# Patient Record
Sex: Female | Born: 1965 | Race: Black or African American | Hispanic: No | Marital: Married | Smoking: Never smoker
Health system: Southern US, Community
[De-identification: ages and names within clinical notes are randomized; demographics above are authoritative.]

## PROBLEM LIST (undated history)

## (undated) DIAGNOSIS — M549 Dorsalgia, unspecified: Secondary | ICD-10-CM

## (undated) DIAGNOSIS — G8929 Other chronic pain: Secondary | ICD-10-CM

## (undated) DIAGNOSIS — R519 Headache, unspecified: Secondary | ICD-10-CM

## (undated) DIAGNOSIS — R51 Headache: Secondary | ICD-10-CM

## (undated) HISTORY — PX: ABDOMINAL HYSTERECTOMY: SHX81

---

## 1998-07-28 ENCOUNTER — Other Ambulatory Visit: Admission: RE | Admit: 1998-07-28 | Discharge: 1998-07-28 | Payer: Self-pay | Admitting: Obstetrics and Gynecology

## 1999-07-27 ENCOUNTER — Other Ambulatory Visit: Admission: RE | Admit: 1999-07-27 | Discharge: 1999-07-27 | Payer: Self-pay | Admitting: *Deleted

## 2000-10-17 ENCOUNTER — Other Ambulatory Visit: Admission: RE | Admit: 2000-10-17 | Discharge: 2000-10-17 | Payer: Self-pay | Admitting: *Deleted

## 2001-11-14 ENCOUNTER — Other Ambulatory Visit: Admission: RE | Admit: 2001-11-14 | Discharge: 2001-11-14 | Payer: Self-pay | Admitting: Obstetrics and Gynecology

## 2002-12-02 ENCOUNTER — Other Ambulatory Visit: Admission: RE | Admit: 2002-12-02 | Discharge: 2002-12-02 | Payer: Self-pay | Admitting: Obstetrics and Gynecology

## 2009-10-29 ENCOUNTER — Encounter: Admission: RE | Admit: 2009-10-29 | Discharge: 2009-10-29 | Payer: Self-pay | Admitting: Obstetrics and Gynecology

## 2010-02-15 ENCOUNTER — Observation Stay (HOSPITAL_COMMUNITY): Admit: 2010-02-15 | Discharge: 2010-02-15 | Payer: Self-pay | Admitting: Obstetrics

## 2010-02-16 ENCOUNTER — Encounter (INDEPENDENT_AMBULATORY_CARE_PROVIDER_SITE_OTHER): Payer: Self-pay | Admitting: Obstetrics

## 2010-02-16 ENCOUNTER — Ambulatory Visit (HOSPITAL_COMMUNITY): Admission: RE | Admit: 2010-02-16 | Discharge: 2010-02-17 | Payer: Self-pay | Admitting: Obstetrics

## 2010-11-07 LAB — CROSSMATCH: Antibody Screen: NEGATIVE

## 2010-11-07 LAB — CBC
HCT: 33.5 % — ABNORMAL LOW (ref 36.0–46.0)
Hemoglobin: 10.3 g/dL — ABNORMAL LOW (ref 12.0–15.0)
MCH: 19.3 pg — ABNORMAL LOW (ref 26.0–34.0)
MCH: 19.7 pg — ABNORMAL LOW (ref 26.0–34.0)
MCV: 62.8 fL — ABNORMAL LOW (ref 78.0–100.0)
Platelets: 341 10*3/uL (ref 150–400)
RBC: 4.38 MIL/uL (ref 3.87–5.11)
RBC: 5.34 MIL/uL — ABNORMAL HIGH (ref 3.87–5.11)
WBC: 13 10*3/uL — ABNORMAL HIGH (ref 4.0–10.5)

## 2010-11-07 LAB — SURGICAL PCR SCREEN
MRSA, PCR: NEGATIVE
Staphylococcus aureus: NEGATIVE

## 2010-11-07 LAB — TYPE AND SCREEN: ABO/RH(D): B POS

## 2010-11-07 LAB — BASIC METABOLIC PANEL
CO2: 27 mEq/L (ref 19–32)
Chloride: 100 mEq/L (ref 96–112)
GFR calc Af Amer: >60 mL/min (ref >60)
Potassium: 3.5 mEq/L (ref 3.5–5.1)
Sodium: 135 mEq/L (ref 135–145)

## 2011-10-31 ENCOUNTER — Other Ambulatory Visit: Payer: Self-pay | Admitting: Family Medicine

## 2011-10-31 DIAGNOSIS — Z1231 Encounter for screening mammogram for malignant neoplasm of breast: Secondary | ICD-10-CM

## 2011-11-07 ENCOUNTER — Ambulatory Visit
Admission: RE | Admit: 2011-11-07 | Discharge: 2011-11-07 | Disposition: A | Discharge status: Home / Self Care | Payer: 59 | Source: Ambulatory Visit | Attending: Family Medicine | Admitting: Family Medicine

## 2011-11-07 DIAGNOSIS — Z1231 Encounter for screening mammogram for malignant neoplasm of breast: Secondary | ICD-10-CM

## 2012-11-01 ENCOUNTER — Other Ambulatory Visit: Payer: Self-pay | Admitting: Family Medicine

## 2012-11-01 DIAGNOSIS — Z1231 Encounter for screening mammogram for malignant neoplasm of breast: Secondary | ICD-10-CM

## 2012-11-26 ENCOUNTER — Ambulatory Visit: Payer: 59

## 2013-07-09 ENCOUNTER — Ambulatory Visit
Admission: RE | Admit: 2013-07-09 | Discharge: 2013-07-09 | Disposition: A | Discharge status: Home / Self Care | Payer: 59 | Source: Ambulatory Visit | Attending: Family Medicine | Admitting: Family Medicine

## 2013-07-09 DIAGNOSIS — Z1231 Encounter for screening mammogram for malignant neoplasm of breast: Secondary | ICD-10-CM

## 2014-07-01 ENCOUNTER — Other Ambulatory Visit: Payer: Self-pay

## 2014-07-01 DIAGNOSIS — Z1231 Encounter for screening mammogram for malignant neoplasm of breast: Secondary | ICD-10-CM

## 2014-07-22 ENCOUNTER — Ambulatory Visit
Admission: RE | Admit: 2014-07-22 | Discharge: 2014-07-22 | Disposition: A | Discharge status: Home / Self Care | Payer: 59 | Source: Ambulatory Visit

## 2014-07-22 DIAGNOSIS — Z1231 Encounter for screening mammogram for malignant neoplasm of breast: Secondary | ICD-10-CM

## 2014-10-03 ENCOUNTER — Emergency Department (HOSPITAL_COMMUNITY)
Admission: EM | Admit: 2014-10-03 | Discharge: 2014-10-03 | Disposition: A | Discharge status: Home / Self Care | Payer: 59 | Attending: Emergency Medicine | Admitting: Emergency Medicine

## 2014-10-03 ENCOUNTER — Encounter (HOSPITAL_COMMUNITY): Payer: Self-pay | Admitting: *Deleted

## 2014-10-03 ENCOUNTER — Emergency Department (HOSPITAL_COMMUNITY): Payer: 59

## 2014-10-03 ENCOUNTER — Other Ambulatory Visit: Payer: Self-pay

## 2014-10-03 DIAGNOSIS — S199XXA Unspecified injury of neck, initial encounter: Secondary | ICD-10-CM | POA: Insufficient documentation

## 2014-10-03 DIAGNOSIS — M7918 Myalgia, other site: Secondary | ICD-10-CM

## 2014-10-03 DIAGNOSIS — Y92410 Unspecified street and highway as the place of occurrence of the external cause: Secondary | ICD-10-CM | POA: Diagnosis not present

## 2014-10-03 DIAGNOSIS — Z880 Allergy status to penicillin: Secondary | ICD-10-CM | POA: Insufficient documentation

## 2014-10-03 DIAGNOSIS — S3992XA Unspecified injury of lower back, initial encounter: Secondary | ICD-10-CM | POA: Insufficient documentation

## 2014-10-03 DIAGNOSIS — Y9389 Activity, other specified: Secondary | ICD-10-CM | POA: Insufficient documentation

## 2014-10-03 DIAGNOSIS — Y998 Other external cause status: Secondary | ICD-10-CM | POA: Diagnosis not present

## 2014-10-03 DIAGNOSIS — S4991XA Unspecified injury of right shoulder and upper arm, initial encounter: Secondary | ICD-10-CM | POA: Insufficient documentation

## 2014-10-03 DIAGNOSIS — G8929 Other chronic pain: Secondary | ICD-10-CM | POA: Diagnosis not present

## 2014-10-03 HISTORY — DX: Headache: R51

## 2014-10-03 HISTORY — DX: Other chronic pain: G89.29

## 2014-10-03 HISTORY — DX: Dorsalgia, unspecified: M54.9

## 2014-10-03 HISTORY — DX: Headache, unspecified: R51.9

## 2014-10-03 MED ORDER — OXYCODONE-ACETAMINOPHEN 5-325 MG PO TABS
ORAL_TABLET | ORAL | Status: AC
Start: 2014-10-03 — End: ?

## 2014-10-03 MED ORDER — IBUPROFEN 400 MG PO TABS
400.0000 mg | ORAL_TABLET | Freq: Once | ORAL | Status: AC
  Administered 2014-10-03: 400 mg via ORAL
  Filled 2014-10-03: qty 1

## 2014-10-03 MED ORDER — ACETAMINOPHEN 500 MG PO TABS
1000.0000 mg | ORAL_TABLET | Freq: Once | ORAL | Status: AC
Start: 2014-10-03 — End: 2014-10-03
  Administered 2014-10-03: 1000 mg via ORAL
  Filled 2014-10-03: qty 2

## 2014-10-03 MED ORDER — METHOCARBAMOL 500 MG PO TABS: 1000.0000 mg | ORAL_TABLET | Freq: Four times a day (QID) | ORAL | Status: AC | PRN

## 2014-10-03 MED ORDER — NAPROXEN 250 MG PO TABS: 250.0000 mg | ORAL_TABLET | Freq: Two times a day (BID) | ORAL | Status: AC | PRN

## 2014-10-03 NOTE — Discharge Instructions (Signed)
°Emergency Department Resource Guide °1) Find a Doctor and Pay Out of Pocket °Although you won't have to find out who is covered by your insurance plan, it is a good idea to ask around and get recommendations. You will then need to call the office and see if the doctor you have chosen will accept you as a new patient and what types of options they offer for patients who are self-pay. Some doctors offer discounts or will set up payment plans for their patients who do not have insurance, but you will need to ask so you aren't surprised when you get to your appointment. ° °2) Contact Your Local Health Department °Not all health departments have doctors that can see patients for sick visits, but many do, so it is worth a call to see if yours does. If you don't know where your local health department is, you can check in your phone book. The CDC also has a tool to help you locate your state's health department, and many state websites also have listings of all of their local health departments. ° °3) Find a Walk-in Clinic °If your illness is not likely to be very severe or complicated, you may want to try a walk in clinic. These are popping up all over the country in pharmacies, drugstores, and shopping centers. They're usually staffed by nurse practitioners or physician assistants that have been trained to treat common illnesses and complaints. They're usually fairly quick and inexpensive. However, if you have serious medical issues or chronic medical problems, these are probably not your best option. ° °No Primary Care Doctor: °- Call [facility] at  832-8000 - they can help you locate a primary care doctor that  accepts your insurance, provides certain services, etc. °- Physician Referral Service- 1-800-533-3463 ° °Chronic Pain Problems: °Organization         Address  Phone   Notes  °[facility]  (336) 297-2271 Patients need to be referred by their primary care doctor.  ° °Medication  Assistance: °Organization         Address  Phone   Notes  °[place] [facility] [address] °[place], [place] [address] (336) 641-8030 --Must be a resident of [place] °-- Must have NO insurance coverage whatsoever (no Medicaid/ Medicare, etc.) °-- The pt. MUST have a primary care doctor that directs their care regularly and follows them in the community °  °[facility]  (866) 331-1348   °[employer]  (888) 892-1162   ° °Agencies that provide inexpensive medical care: °Organization         Address  Phone   Notes  °[facility]  (336) 832-8035   °[facility]    (336) 832-7272   °[facility] [address] °[place], [place] [address] (336) 832-4777   °[facility] [address], °[place] (336) 271-4999   °[facility]    (336) 373-0678   °[facility]    (336) 272-1050   °[facility] ° [address], [place] Phone:  (336) 832-4444, Fax:  (336) 832-4440 Hours of Operation:  9 am - 6 pm, M-F.  Also accepts Medicaid/Medicare and self-pay.  °[facility] ° [address], [place] Phone: (336) 832-3150, Fax: (336) 832-3151. Hours of Operation:  8:30 am - 5:30 pm, M-F.  Also accepts Medicaid and self-pay.  °[facility] [place] [address], [place] Phone: (336) 878-6027   °[facility] [address], [place], [place] (336)723-1848, Ext. 123 Mondays & Thursdays: 7-9 AM.  First 15 patients are seen on a first come, first serve basis. °  ° °Medicaid-accepting [place] Providers: ° °Organization         Address  Phone   Notes  °[facility] [address], [place] (336) 641-2100 Also accepts self-pay patients.  °[facility] [address], [place] ° (336) 856-9996   °[facility] [address], [place]  (336) 288-8857   °[facility] [address] [place] [address], [place] (336) 299-7000   °Veita Bland [address], [place]  ° (336) 373-1557 Only accepts [place] Medicaid patients after they have their name applied to their card.  ° °Self-Pay (no insurance) in [place]: ° °Organization         Address  Phone   Notes  °Sickle Cell Patients, [facility] [address], [place] (336) 832-1970   °[facility] [address], [place] (336) 832-4400   °[facility] ° [address], [place] (336) 992-4800   °[facility]/Dr. Osei-Bonsu ° [address] [place] [address], [place] or [address], [place] (336) 841-8500 Phone number for both [place] and [place] locations is the same.  °[facility] [address], [place] (336) 299-0000   °[facility] [address] [place] [address], [place] or [address] (336) 852-7530 °(336) 878-2260   °[facility] [address], [place] (336) 350-1642, phone; (336) 294-5005, fax Sees patients 1st and 3rd Saturday of every month.  Must not qualify for public or private insurance (i.e. Medicaid, Medicare, [place] Health Choice, Veterans' Benefits) • Household income should be no more than 200% of the poverty level •The clinic cannot treat you if you are pregnant or think you are pregnant • Sexually transmitted diseases are not treated at the clinic.  ° ° °Dental Care: °Organization         Address  Phone  Notes  °[place] [facility] [address], [place] (336) 641-6152 Accepts children up to age 21 who are enrolled in Medicaid or [place] Health Choice; pregnant women with a Medicaid card; and children who have applied for Medicaid or [place] Health Choice, but were declined, whose parents can pay a reduced fee at time of service.  °[place]  [facility] [place]  [address], [place] (336) 641-7733 Accepts children up to age 21 who are enrolled in Medicaid or [place] Health Choice; pregnant women with a Medicaid card; and children who have applied for Medicaid or  Health Choice, but were declined, whose parents can pay a reduced fee at time of service.  °[facility] ° [address],  (336) 641-4533 Patients are seen by appointment only. Walk-ins are not accepted. [facility] will see patients 18 years of age and older. °Monday - Tuesday (8am-5pm) °Most Wednesdays (8:30-5pm) °$30 per visit, cash only  °[facility] ° [address], [place] (336) 641-4533 Patients are seen by appointment only. Walk-ins are not accepted. [facility] will see patients 18 years of age and older. °One   Wednesday Evening (Monthly: Volunteer Based).  $30 per visit, cash only  °[facility]  (919) 537-3737 for adults; Children under age 4, call Graduate Pediatric Dentistry at (919) [phone]. Children aged 4-14, please call (919) [phone] to request a pediatric application. ° Dental services are provided in all areas of dental care including fillings, crowns and bridges, complete and partial dentures, implants, gum treatment, root canals, and extractions. Preventive care is also provided. Treatment is provided to both adults and children. °Patients are selected via a lottery and there is often a waiting list. °  °[facility] [address], °[place] ° (336) 763-8833 [email or web address] °  °[facility] [address], [place], [place] (336)723-1848, Ext. 123 Second and Fourth Thursday of each month, opens at 6:30 AM; Clinic ends at 9 AM.  Patients are seen on a first-come first-served basis, and a limited number are seen during each clinic.  ° °[facility] ° [address], [place], [place] (336) 723-7904    Eligibility Requirements °You must have lived in [place], [place], or [place] counties for at least the last three months. °  You cannot be eligible for state or federal sponsored healthcare insurance, including Veterans Administration, Medicaid, or Medicare. °  You generally cannot be eligible for healthcare insurance through your employer.  °  How to apply: °Eligibility screenings are held every Tuesday and Wednesday afternoon from 1:00 pm until 4:00 pm. You do not need an appointment for the interview!  °[facility] [address], [place], [place] 336-631-2330   °[facility]  336-342-8273   °[facility]  336-703-3100   °[facility]  336-570-6415   ° °Behavioral Health Resources in the Community: °Intensive Outpatient Programs °Organization         Address  Phone  Notes  °[facility] [address], [place], [place] 336-878-6098   °[facility] [address], [place], [place] 336-832-9800   °[facility] [address], [place], [place] ° 336-882-2125   °[facility] [address],  °[place], [place] 1-800-853-5163 or 336-641-4981   °Substance Abuse Resources °Organization         Address  Phone  Notes  °[facility]  336-882-2125   °[facility]  336-784-9470   °[facility]  336-285-9073   °[facility]  336-845-3988   °Residential & Outpatient Substance Abuse Program  1-800-659-3381   °Psychological Services °Organization         Address  Phone  Notes  °[facility]  336- 832-9600   °[facility]  336- 378-7881   °[facility] [address], [place] 1-800-853-5163 or 336-641-4981   ° °Mobile Crisis Teams °Organization         Address  Phone  Notes  °[facility], Mobile Crisis Care Unit  1-877-626-1772   °Assertive °[facility] ° [address]  [place], [place] 336-834-9664   °Sharon DeEsch [address] °[place] 336-554-5454   ° °Self-Help/Support Groups °Organization         Address  Phone             Notes  °[facility] - variety of support groups  336- 373-1402 Call for more information  °Narcotics Anonymous (NA), [facility] [address], °[place]  2 meetings at this location  ° °  Residential Treatment Programs °Organization         Address  Phone  Notes  °[facility] [address],    °[place]  1-866-801-8205   °[facility] ° [address], [place], [place] 704-293-8524   °[facility] [address], [place] 336-845-3988 Admissions: 8am-3pm M-F  °[facility] [address]    °[place], [place] 336-841-1104   °[facility] [address], [place], [place] 336-379-7146   °[facility] [address]  °[place], [place] 336-285-9073   °[facility] - Intensive Outpatient [address], [place], [place] 336-852-3033   °[facility] [address]  °[place], [place] 1-877-615-2722 or 336-784-9470   °[facility] [address] [place], [place] 336-227-7417 Accepts Medicaid  °[facility] [address]  °[place] 1-800-659-3381 Substance Abuse/Addiction Treatment  ° °[place] Behavioral Health Resources °Organization         Address  Phone  Notes  °[facility]  (888) 581-9988   °Julie Brannon, PhD [address] [place], [place]   (336) 349-5553 or (336) 951-0000   °[facility]   [address] °[place], [place] (336) 349-4454   °[facility] [address], [place], [place] (336) 342-8316 Insurance/Medicaid/sponsorship through [facility]  °[facility] [address]                                    [place], [place] (336) 342-8316 Therapy/tele-psych/case    °[facility] [address]  ° [place], [place] (336) 349-2233    °Dr. Arfeen  (336) 349-4544   °[facility] [place]  [employer] [place] [facility] 1) [address], [place] °2) [address], [place] °3)  [address], [place] (336) 349-3220 °(336) 342-7768 ° °(336) 342-8140   °[place] [facility] (336) 342-1394 or (336) 342-3537 (After Hours)    ° ° °Take the prescriptions as directed.  Apply moist heat or ice to the area(s) of discomfort, for 15 minutes at a time, several times per day for the next few days.  Do not fall asleep on a heating or ice pack.  Call your regular medical doctor today to schedule a follow up appointment in the next 3 days.  Return to the Emergency Department immediately if worsening. ° °
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
## 2016-04-21 ENCOUNTER — Other Ambulatory Visit: Payer: Self-pay | Admitting: Family Medicine

## 2016-04-21 DIAGNOSIS — Z1231 Encounter for screening mammogram for malignant neoplasm of breast: Secondary | ICD-10-CM

## 2016-09-15 ENCOUNTER — Ambulatory Visit
Admission: RE | Admit: 2016-09-15 | Discharge: 2016-09-15 | Disposition: A | Discharge status: Home / Self Care | Payer: 59 | Source: Ambulatory Visit | Attending: Family Medicine | Admitting: Family Medicine

## 2016-09-15 DIAGNOSIS — Z1231 Encounter for screening mammogram for malignant neoplasm of breast: Secondary | ICD-10-CM

## 2019-03-14 ENCOUNTER — Other Ambulatory Visit: Payer: Self-pay | Admitting: Physician Assistant

## 2019-03-14 DIAGNOSIS — Z1231 Encounter for screening mammogram for malignant neoplasm of breast: Secondary | ICD-10-CM

## 2019-04-30 ENCOUNTER — Other Ambulatory Visit: Payer: Self-pay

## 2019-04-30 ENCOUNTER — Ambulatory Visit
Admission: RE | Admit: 2019-04-30 | Discharge: 2019-04-30 | Disposition: A | Discharge status: Home / Self Care | Payer: 59 | Source: Ambulatory Visit | Attending: Physician Assistant | Admitting: Physician Assistant

## 2019-04-30 DIAGNOSIS — Z1231 Encounter for screening mammogram for malignant neoplasm of breast: Secondary | ICD-10-CM

## 2020-06-30 ENCOUNTER — Other Ambulatory Visit: Payer: Self-pay | Admitting: Physician Assistant

## 2020-06-30 DIAGNOSIS — Z1231 Encounter for screening mammogram for malignant neoplasm of breast: Secondary | ICD-10-CM

## 2020-09-03 ENCOUNTER — Ambulatory Visit: Payer: 59

## 2020-10-14 ENCOUNTER — Ambulatory Visit
Admission: RE | Admit: 2020-10-14 | Discharge: 2020-10-14 | Disposition: A | Discharge status: Home / Self Care | Payer: 59 | Source: Ambulatory Visit | Attending: Physician Assistant | Admitting: Physician Assistant

## 2020-10-14 ENCOUNTER — Other Ambulatory Visit: Payer: Self-pay

## 2020-10-14 DIAGNOSIS — Z1231 Encounter for screening mammogram for malignant neoplasm of breast: Secondary | ICD-10-CM

## 2021-11-15 ENCOUNTER — Other Ambulatory Visit: Payer: Self-pay | Admitting: Physician Assistant

## 2021-11-15 DIAGNOSIS — Z1231 Encounter for screening mammogram for malignant neoplasm of breast: Secondary | ICD-10-CM

## 2021-11-17 ENCOUNTER — Ambulatory Visit
Admission: RE | Admit: 2021-11-17 | Discharge: 2021-11-17 | Disposition: A | Discharge status: Home / Self Care | Payer: 59 | Source: Ambulatory Visit | Attending: Physician Assistant | Admitting: Physician Assistant

## 2021-11-17 DIAGNOSIS — Z1231 Encounter for screening mammogram for malignant neoplasm of breast: Secondary | ICD-10-CM

## 2022-09-02 IMAGING — MG MM DIGITAL SCREENING BILAT W/ TOMO AND CAD
8 series · 8 of 24 positions shown · non-contrast
Comparison: Previous exam(s).

CLINICAL DATA: Screening.

EXAM:
DIGITAL SCREENING BILATERAL MAMMOGRAM WITH TOMOSYNTHESIS AND CAD
TECHNIQUE: Bilateral screening digital craniocaudal and mediolateral oblique
mammograms were obtained. Bilateral screening digital breast
tomosynthesis was performed. The images were evaluated with
computer-aided detection.

[R MLO synth-2D]
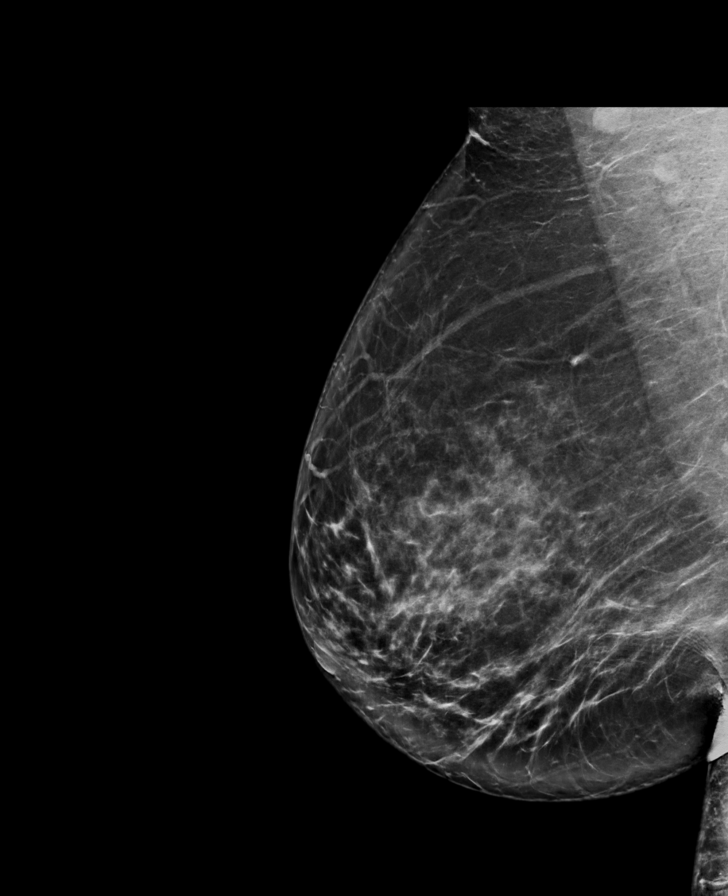

[R CC synth-2D]
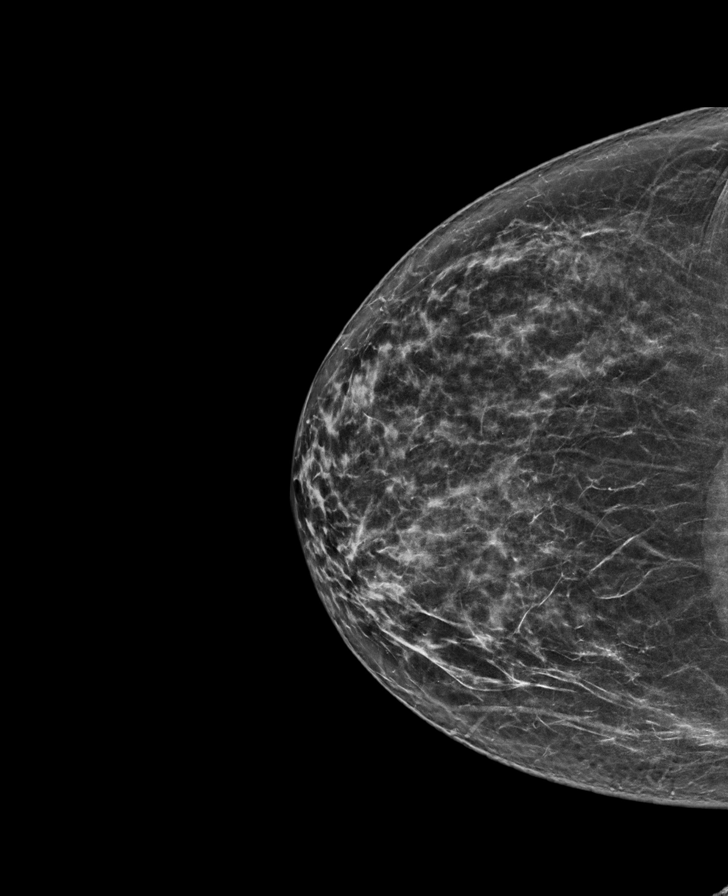

[L MLO synth-2D]
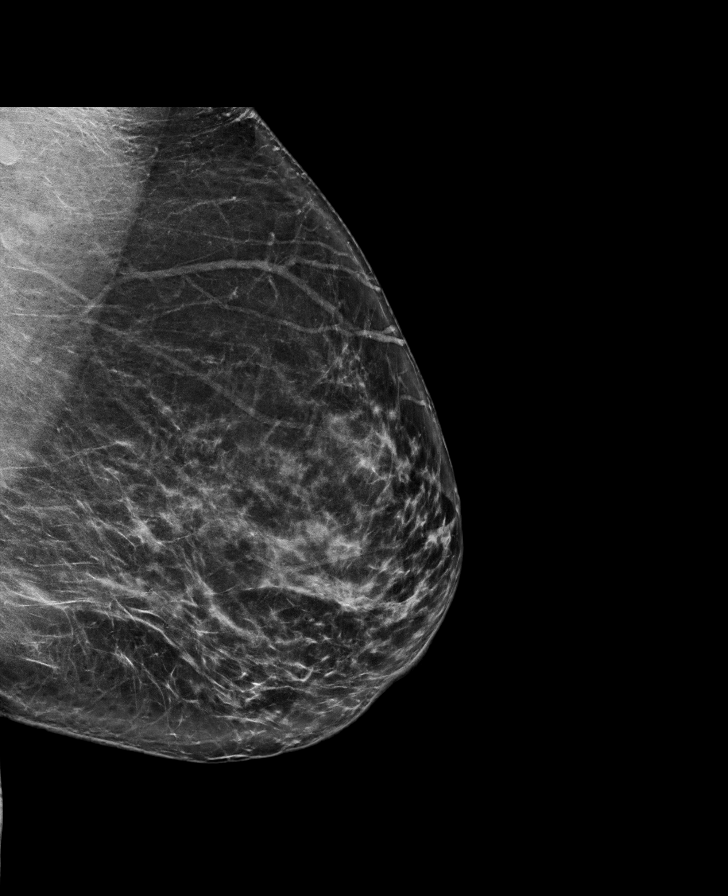

[L CC synth-2D]
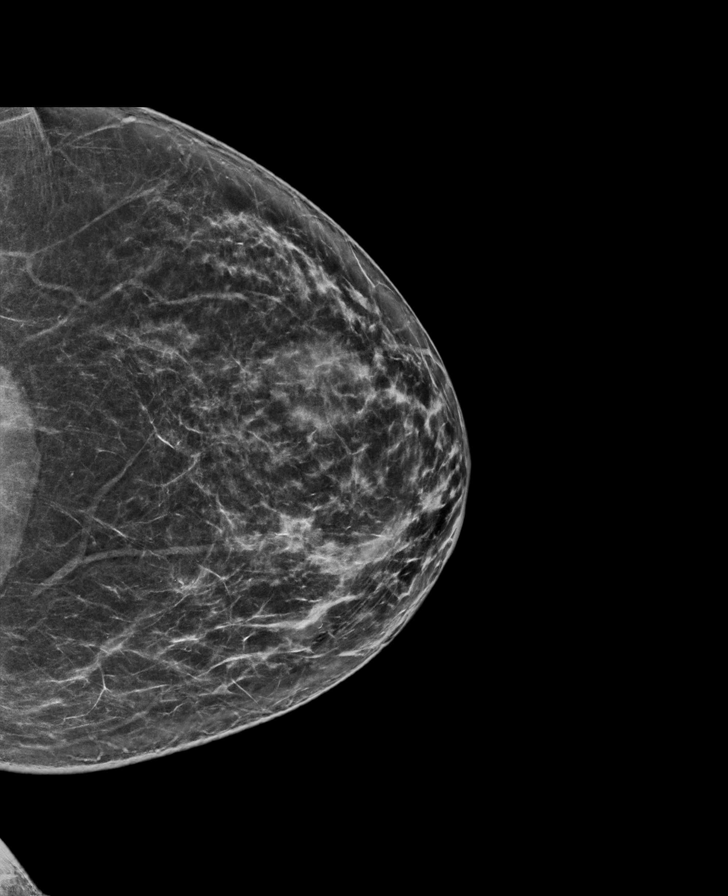

[L MLO tomo · tomo slice 43/84.0]
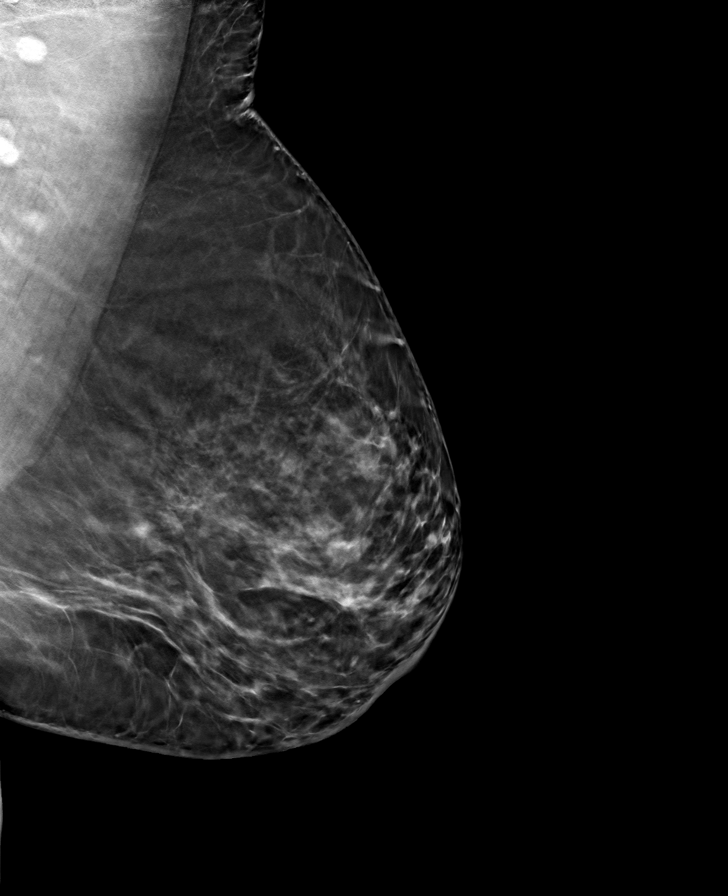

[R MLO tomo · tomo slice 47/93.0]
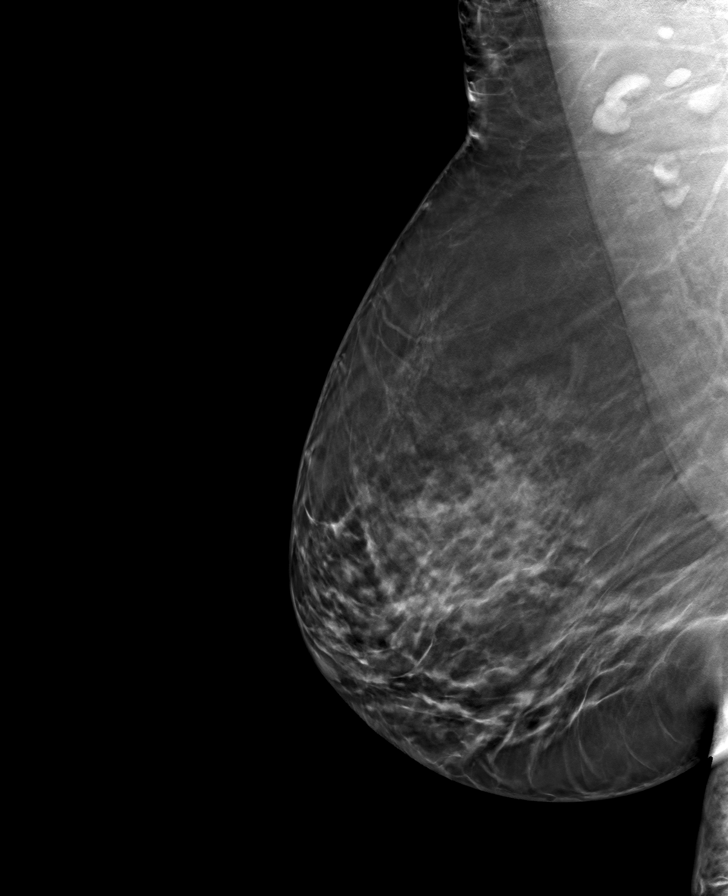

[L CC tomo · tomo slice 39/77.0]
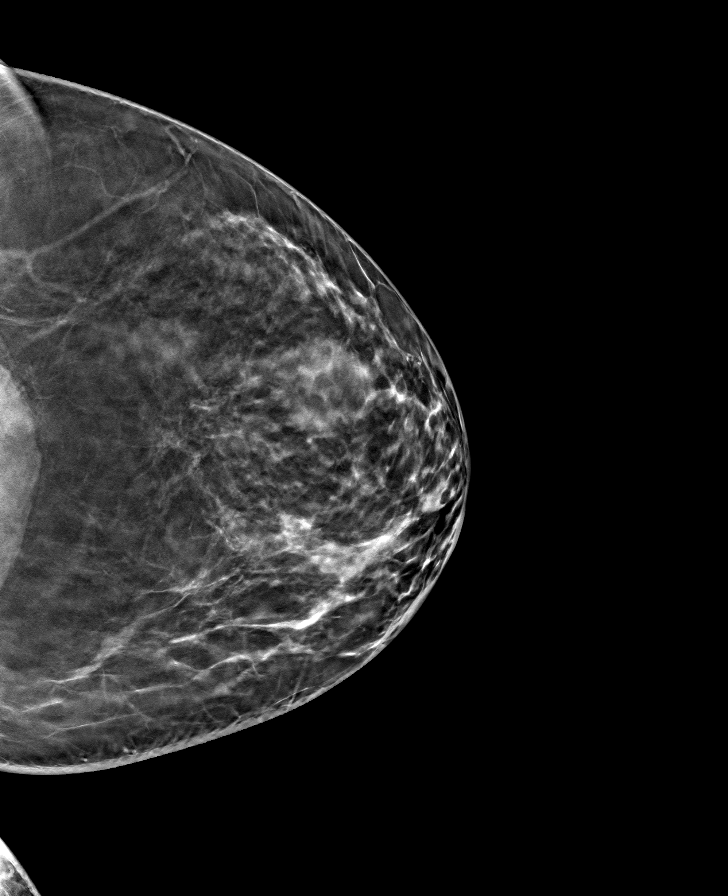

[R CC tomo · tomo slice 37/74.0]
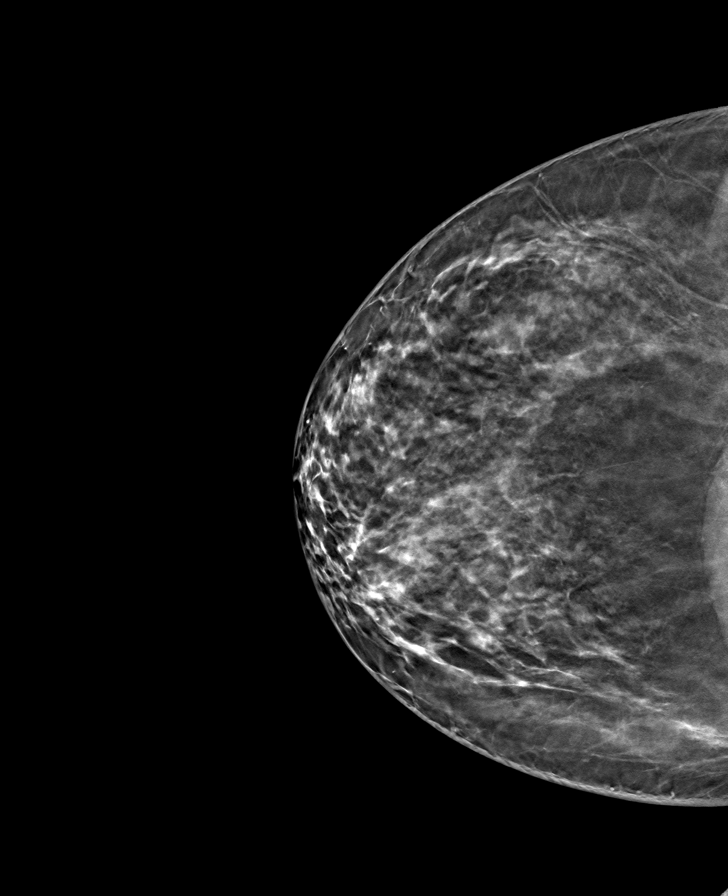

[8 of 24 positions shown; findings below may reference images not displayed]

ACR Breast Density Category c: The breast tissue is heterogeneously
dense, which may obscure small masses.
FINDINGS: There are no findings suspicious for malignancy.
IMPRESSION: No mammographic evidence of malignancy. A result letter of this
screening mammogram will be mailed directly to the patient.

RECOMMENDATION:
Screening mammogram in one year. (Code:Q3-W-BC3)

BI-RADS CATEGORY  1: Negative.

## 2024-04-18 ENCOUNTER — Other Ambulatory Visit: Payer: Self-pay | Admitting: Physician Assistant

## 2024-04-18 DIAGNOSIS — Z1231 Encounter for screening mammogram for malignant neoplasm of breast: Secondary | ICD-10-CM

## 2024-04-30 ENCOUNTER — Ambulatory Visit
Admission: RE | Admit: 2024-04-30 | Discharge: 2024-04-30 | Disposition: A | Discharge status: Home / Self Care | Source: Ambulatory Visit | Attending: Physician Assistant | Admitting: Physician Assistant

## 2024-04-30 DIAGNOSIS — Z1231 Encounter for screening mammogram for malignant neoplasm of breast: Secondary | ICD-10-CM

## 2025-01-07 ENCOUNTER — Ambulatory Visit: Admitting: Dermatology
